# Patient Record
Sex: Male | Born: 1985 | Race: Black or African American | Hispanic: No | Marital: Single | Smoking: Never smoker
Health system: Southern US, Community
[De-identification: ages and names within clinical notes are randomized; demographics above are authoritative.]

---

## 2007-05-08 ENCOUNTER — Emergency Department (HOSPITAL_COMMUNITY): Admission: EM | Admit: 2007-05-08 | Discharge: 2007-05-08 | Payer: Self-pay | Admitting: Emergency Medicine

## 2008-11-04 ENCOUNTER — Emergency Department (HOSPITAL_COMMUNITY): Admission: EM | Admit: 2008-11-04 | Discharge: 2008-11-04 | Payer: Self-pay | Admitting: Emergency Medicine

## 2011-05-16 ENCOUNTER — Encounter (HOSPITAL_BASED_OUTPATIENT_CLINIC_OR_DEPARTMENT_OTHER): Payer: Self-pay | Admitting: *Deleted

## 2011-05-16 ENCOUNTER — Emergency Department (HOSPITAL_BASED_OUTPATIENT_CLINIC_OR_DEPARTMENT_OTHER)
Admission: EM | Admit: 2011-05-16 | Discharge: 2011-05-16 | Disposition: A | Discharge status: Home / Self Care | Payer: Self-pay | Attending: Emergency Medicine | Admitting: Emergency Medicine

## 2011-05-16 ENCOUNTER — Emergency Department (INDEPENDENT_AMBULATORY_CARE_PROVIDER_SITE_OTHER): Payer: Self-pay

## 2011-05-16 DIAGNOSIS — W010XXA Fall on same level from slipping, tripping and stumbling without subsequent striking against object, initial encounter: Secondary | ICD-10-CM | POA: Insufficient documentation

## 2011-05-16 DIAGNOSIS — S62639A Displaced fracture of distal phalanx of unspecified finger, initial encounter for closed fracture: Secondary | ICD-10-CM

## 2011-05-16 DIAGNOSIS — Y9367 Activity, basketball: Secondary | ICD-10-CM | POA: Insufficient documentation

## 2011-05-16 DIAGNOSIS — M79609 Pain in unspecified limb: Secondary | ICD-10-CM | POA: Insufficient documentation

## 2011-05-16 DIAGNOSIS — R209 Unspecified disturbances of skin sensation: Secondary | ICD-10-CM | POA: Insufficient documentation

## 2011-05-16 DIAGNOSIS — W19XXXA Unspecified fall, initial encounter: Secondary | ICD-10-CM

## 2011-05-16 MED ORDER — IBUPROFEN 200 MG PO TABS
600.0000 mg | ORAL_TABLET | Freq: Once | ORAL | Status: AC
  Administered 2011-05-16: 600 mg via ORAL
  Filled 2011-05-16: qty 1

## 2011-05-16 MED ORDER — IBUPROFEN 600 MG PO TABS: 600.0000 mg | ORAL_TABLET | Freq: Four times a day (QID) | ORAL | Status: AC | PRN

## 2011-05-16 MED ORDER — TRAMADOL HCL 50 MG PO TABS: 50.0000 mg | ORAL_TABLET | Freq: Four times a day (QID) | ORAL | Status: AC | PRN

## 2011-05-16 NOTE — Discharge Instructions (Signed)
Cast or Splint Care Casts and splints support injured limbs and keep bones from moving while they heal.  HOME CARE  Keep the cast or splint uncovered during the drying period.   A plaster cast can take 24 to 48 hours to dry.   A fiberglass cast will dry in less than 1 hour.   Do not rest the cast on anything harder than a pillow for 24 hours.   Do not put weight on your injured limb. Do not put pressure on the cast. Wait for your doctor's approval.   Keep the cast or splint dry.   Cover the cast or splint with a plastic bag during baths or wet weather.   If you have a cast over your chest and belly (trunk), take sponge baths until the cast is taken off.   Keep your cast or splint clean. Wash a dirty cast with a damp cloth.   Do not put any objects under your cast or splint. Do not scratch the skin under the cast with an object.   Do not take out the padding from inside your cast.   Exercise your joints near the cast as told by your doctor.   Raise (elevate) your injured limb on 1 or 2 pillows for the first 1 to 3 days.  GET HELP RIGHT AWAY IF:  Your cast or splint cracks.   Your cast or splint is too tight or too loose.   You itch badly under the cast.   Your cast gets wet or has a soft spot.   You have a bad smell coming from the cast.   You get an object stuck under the cast.   Your skin around the cast becomes red or raw.   You have new or more pain after the cast is put on.   You have fluid leaking through the cast.   You cannot move your fingers or toes.   Your fingers or toes turn colors or are cool, painful, or puffy (swollen).   You have tingling or lose feeling (numbness) around the injured area.   You have pain or pressure under the cast.   You have trouble breathing or have shortness of breath.   You have chest pain.  MAKE SURE YOU:  Understand these instructions.   Will watch your condition.   Will get help right away if you are not doing  well or get worse.  Document Released: 04/20/2010 Document Revised: 12/08/2010 Document Reviewed: 04/20/2010 ExitCare Patient Information 2012 [employer].Finger Fracture (Phalangeal) A broken bone of the finger (phalangealfracture) is a common injury for athletes. A single injury (trauma) is likely to fracture multiple bones on the same or different fingers. SYMPTOMS   Severe pain, at the time of injury.   Pain, tenderness, swelling, and later bruising of the finger and then the hand.   Visible deformity, if the fracture is complete and the bone fragments separate enough to distort the normal shape.   Numbness or coldness from swelling in the finger, causing pressure on blood vessels or nerves (uncommon).  CAUSES  Direct or indirect injury (trauma) to the finger.  RISK INCREASES WITH:   Contact sports (football, rugby) or other sports where injury to the hand is likely (soccer, baseball, basketball).   Sports that require hitting (boxing, martial arts).   History of bone or joint disease, such as osteoporosis, or previous bone restraint.   Poor hand strength and flexibility.  PREVENTION   For contact sports, wear   appropriate and properly fitted protective equipment for the hand.   Learn and use proper technique when hitting, punching, or landing after a fall.   If you had a previous finger injury or hand restraint, use tape or padding to protect the finger when playing sports where finger injury is likely.  PROGNOSIS  With proper treatment and normal alignment of the bones, healing can usually be expected in 4 to 6 weeks. Sometimes, surgery is needed.  RELATED COMPLICATIONS   Fracture does not heal (nonunion).   Bone heals in wrong position (malunion).   Chronic pain, stiffness, or swelling of the hand.   Excessive bleeding, causing pressure on nerves and blood vessels.   Unstable or arthritic joint, following repeated injury or delayed treatment.   Hindrance of  normal growth in children.   Infection in skin broken over the fracture (open fracture) or at the incision or pin sites from surgery.   Shortening of injured bones.   Bony bumps or loss of shape of the fingers.   Arthritic or stiff finger joint, if the fracture reaches the joint.  TREATMENT  If the bones are properly aligned, treatment involves ice and medicine to reduce pain and inflammation. Then, the finger is restrained for 4 or more weeks, to allow for healing. If the fracture is out of alignment (displaced), involves more than one bone, or involves a joint, surgery is usually advised. Surgery often involves placing removable pins, screws, and sometimes plates, to hold the bones in proper alignment. After restraint (with or without surgery), stretching and strengthening exercises are needed. Exercises may be completed at home or with a therapist. For certain sports, wearing a splint or having the finger taped during future activity is advised.  MEDICATION   If pain medicine is needed, nonsteroidal anti-inflammatory medicines (aspirin and ibuprofen), or other minor pain relievers (acetaminophen), are often advised.   Do not take pain medicine for 7 days before surgery.   Prescription pain relievers are usually prescribed only after surgery. Use only as directed and only as much as you need.  COLD THERAPY   Cold treatment (icing) relieves pain and reduces inflammation. Cold treatment should be applied for 10 to 15 minutes every 2 to 3 hours, and immediately after activity that aggravates your symptoms. Use ice packs or an ice massage.  SEEK MEDICAL CARE IF:   Pain, tenderness, or swelling gets worse, despite treatment.   You experience pain, numbness, or coldness in the hand.   Blue, gray, or dark color appears in the fingernails.   Any of the following occur after surgery: fever, increased pain, swelling, redness, drainage of fluids, or bleeding in the affected area.   New,  unexplained symptoms develop. (Drugs used in treatment may produce side effects.)  Document Released: 12/19/2004 Document Revised: 12/08/2010 Document Reviewed: 04/02/2008 ExitCare Patient Information 2012 [employer]. 

## 2011-05-16 NOTE — ED Provider Notes (Signed)
History     CSN: [id number]  Arrival date & time 05/16/11  4098   First MD Initiated Contact with Patient 05/16/11 [phone]      Chief Complaint  Patient presents with  . Hand Pain    (Consider location/radiation/quality/duration/timing/severity/associated sxs/prior treatment) HPI Pt p/w R hand pain after basketball injury 3-4 weeks ago. Pt states he fell onto outstretched R hand. Immediate pain and swelling. Pain has persisted and states he was stretching hand to today with exacerbation of pain. Pt mostly c/o pain of 5 th digit and ulnar surface of hand w/o wrist pain. +decreased sensation over 5th digit. Unable to fully close hand on right.  History reviewed. No pertinent past medical history.  History reviewed. No pertinent past surgical history.  History reviewed. No pertinent family history.  History  Substance Use Topics  . Smoking status: Not on file  . Smokeless tobacco: Not on file  . Alcohol Use: Not on file      Review of Systems  Skin: Negative for wound.  Neurological: Positive for numbness. Negative for weakness.    Allergies  Review of patient's allergies indicates no known allergies.  Home Medications   Current Outpatient Rx  Name Route Sig Dispense Refill  . IBUPROFEN 600 MG PO TABS Oral Take 1 tablet (600 mg total) by mouth every 6 (six) hours as needed for pain. 30 tablet 0  . TRAMADOL HCL 50 MG PO TABS Oral Take 1 tablet (50 mg total) by mouth every 6 (six) hours as needed for pain. 15 tablet 0    BP 117/69  Pulse 56  Temp(Src) 98.2 F (36.8 C) (Oral)  Resp 18  SpO2 100%  Physical Exam  Nursing note and vitals reviewed. Constitutional: He appears well-developed and well-nourished. No distress.  HENT:  Head: Normocephalic.  Neck: Normal range of motion. Neck supple.  Pulmonary/Chest: Effort normal.  Abdominal: Soft.  Musculoskeletal:       R hand with TTP over 5th digit and 5th metacarpal. No wrist or snuffbox TTP. Good cap refill. 2+  radial pulses. Decreased ROM of 5th digit and mildly decreased sensation over dorsal surface of 5th digit.   Neurological:       5/5 motor of intrinsic muscles of hand.   Skin: Skin is warm and dry.       No injury    ED Course  Procedures (including critical care time)  Labs Reviewed - No data to display Dg Hand Complete Right  05/16/2011  *RADIOLOGY REPORT*  Clinical Data: Larey Seat on hand playing basketball 2 weeks ago with pain in the fourth and fifth digits  RIGHT HAND - COMPLETE 3+ VIEW  Comparison: None.  Findings: Only seen on the lateral view there is a small avulsion fracture fragment from the base of the distal phalanx of the fifth digit on the palmar aspect.  No other bony abnormality is seen. MCP, PIP, and DIP joints are normal.  IMPRESSION: Small avulsion fracture fragment from the base of the distal phalanx of the right fifth digit.  Original Report Authenticated By: Juline Patch, M.D.     1. Avulsion fracture of distal phalanx of finger       MDM   Given cont pain and decreased mobility will place in splint and have f/u with hand surgeon.        Loren Racer, MD 05/16/11 1011

## 2011-05-16 NOTE — ED Notes (Signed)
Pt amb to room 2 with quick steady gait in nad. Pt reports injuring his right hand while playing basketball 4 weeks ago, cont with pain and swelling. Denies any other c/o.

## 2013-03-03 ENCOUNTER — Encounter (HOSPITAL_COMMUNITY): Payer: Self-pay | Admitting: Emergency Medicine

## 2013-03-03 ENCOUNTER — Emergency Department (HOSPITAL_COMMUNITY)
Admission: EM | Admit: 2013-03-03 | Discharge: 2013-03-03 | Disposition: A | Discharge status: Home / Self Care | Payer: Self-pay | Attending: Emergency Medicine | Admitting: Emergency Medicine

## 2013-03-03 DIAGNOSIS — K089 Disorder of teeth and supporting structures, unspecified: Secondary | ICD-10-CM | POA: Insufficient documentation

## 2013-03-03 DIAGNOSIS — K0889 Other specified disorders of teeth and supporting structures: Secondary | ICD-10-CM

## 2013-03-03 DIAGNOSIS — K029 Dental caries, unspecified: Secondary | ICD-10-CM | POA: Insufficient documentation

## 2013-03-03 MED ORDER — HYDROCODONE-ACETAMINOPHEN 5-325 MG PO TABS: 1.0000 | ORAL_TABLET | ORAL | Status: DC | PRN

## 2013-03-03 MED ORDER — AMOXICILLIN 500 MG PO CAPS: 500.0000 mg | ORAL_CAPSULE | Freq: Three times a day (TID) | ORAL | Status: DC

## 2013-03-03 NOTE — Discharge Instructions (Signed)
Dental Pain °A tooth ache may be caused by cavities (tooth decay). Cavities expose the nerve of the tooth to air and hot or cold temperatures. It may come from an infection or abscess (also called a boil or furuncle) around your tooth. It is also often caused by dental caries (tooth decay). This causes the pain you are having. °DIAGNOSIS  °Your caregiver can diagnose this problem by exam. °TREATMENT  °· If caused by an infection, it may be treated with medications which kill germs (antibiotics) and pain medications as prescribed by your caregiver. Take medications as directed. °· Only take over-the-counter or prescription medicines for pain, discomfort, or fever as directed by your caregiver. °· Whether the tooth ache today is caused by infection or dental disease, you should see your dentist as soon as possible for further care. °SEEK MEDICAL CARE IF: °The exam and treatment you received today has been provided on an emergency basis only. This is not a substitute for complete medical or dental care. If your problem worsens or new problems (symptoms) appear, and you are unable to meet with your dentist, call or return to this location. °SEEK IMMEDIATE MEDICAL CARE IF:  °· You have a fever. °· You develop redness and swelling of your face, jaw, or neck. °· You are unable to open your mouth. °· You have severe pain uncontrolled by pain medicine. °MAKE SURE YOU:  °· Understand these instructions. °· Will watch your condition. °· Will get help right away if you are not doing well or get worse. °Document Released: 12/19/2004 Document Revised: 03/13/2011 Document Reviewed: 08/07/2007 °ExitCare® Patient Information ©2014 [employer]. ° °Dental Care and Dentist Visits °Dental care supports good overall health. Regular dental visits can also help you avoid dental pain, bleeding, infection, and other more serious health problems in the future. It is important to keep the mouth healthy because diseases in the teeth, gums,  and other oral tissues can spread to other areas of the body. Some problems, such as diabetes, heart disease, and pre-term labor have been associated with poor oral health.  °See your dentist every 6 months. If you experience emergency problems such as a toothache or broken tooth, go to the dentist right away. If you see your dentist regularly, you may catch problems early. It is easier to be treated for problems in the early stages.  °WHAT TO EXPECT AT A DENTIST VISIT  °Your dentist will look for many common oral health problems and recommend proper treatment. At your regular dental visit, you can expect: °· Gentle cleaning of the teeth and gums. This includes scraping and polishing. This helps to remove the sticky substance around the teeth and gums (plaque). Plaque forms in the mouth shortly after eating. Over time, plaque hardens on the teeth as tartar. If tartar is not removed regularly, it can cause problems. Cleaning also helps remove stains. °· Periodic X-rays. These pictures of the teeth and supporting bone will help your dentist assess the health of your teeth. °· Periodic fluoride treatments. Fluoride is a natural mineral shown to help strengthen teeth. Fluoride treatment involves applying a fluoride gel or varnish to the teeth. It is most commonly done in children. °· Examination of the mouth, tongue, jaws, teeth, and gums to look for any oral health problems, such as: °· Cavities (dental caries). This is decay on the tooth caused by plaque, sugar, and acid in the mouth. It is best to catch a cavity when it is small. °· Inflammation of the gums   caused by plaque buildup (gingivitis). °· Problems with the mouth or malformed or misaligned teeth. °· Oral cancer or other diseases of the soft tissues or jaws.  °KEEP YOUR TEETH AND GUMS HEALTHY °For healthy teeth and gums, follow these general guidelines as well as your dentist's specific advice: °· Have your teeth professionally cleaned at the dentist every 6  months. °· Brush twice daily with a fluoride toothpaste. °· Floss your teeth daily.  °· Ask your dentist if you need fluoride supplements, treatments, or fluoride toothpaste. °· Eat a healthy diet. Reduce foods and drinks with added sugar. °· Avoid smoking. °TREATMENT FOR ORAL HEALTH PROBLEMS °If you have oral health problems, treatment varies depending on the conditions present in your teeth and gums. °· Your caregiver will most likely recommend good oral hygiene at each visit. °· For cavities, gingivitis, or other oral health disease, your caregiver will perform a procedure to treat the problem. This is typically done at a separate appointment. Sometimes your caregiver will refer you to another dental specialist for specific tooth problems or for surgery. °SEEK IMMEDIATE DENTAL CARE IF: °· You have pain, bleeding, or soreness in the gum, tooth, jaw, or mouth area. °· A permanent tooth becomes loose or separated from the gum socket. °· You experience a blow or injury to the mouth or jaw area. °Document Released: 08/31/2010 Document Revised: 03/13/2011 Document Reviewed: 08/31/2010 °ExitCare® Patient Information ©2014 [employer]. ° °

## 2013-03-03 NOTE — ED Provider Notes (Signed)
CSN: [id number]     Arrival date & time 03/03/13  1001 History   First MD Initiated Contact with Patient 03/03/13 1041     Chief Complaint  Patient presents with  . Dental Pain     (Consider location/radiation/quality/duration/timing/severity/associated sxs/prior Treatment) HPI   Patient presents to the emergency department with a dental complaint. Symptoms began  A few weeks ago but has been progressively getting worse. The patient has tried to alleviate pain with Ibuprofen.  Pain rated at a 10/10, characterized as throbbing in nature and located right lower back wisdom tooth. He reports it has a hole in it. He has a dental appointment coming up at [facility]. Patient denies fever, night sweats, chills, difficulty swallowing or opening mouth, SOB, nuchal rigidity or decreased ROM of neck.  Patient does not have a dentist and requests a resource guide at discharge.   History reviewed. No pertinent past medical history. History reviewed. No pertinent past surgical history. History reviewed. No pertinent family history. History  Substance Use Topics  . Smoking status: Never Smoker   . Smokeless tobacco: Not on file  . Alcohol Use: Yes     Comment: social    Review of Systems   The patient denies anorexia, fever, weight loss,, vision loss, decreased hearing, hoarseness, chest pain, syncope, dyspnea on exertion, peripheral edema, balance deficits, hemoptysis, abdominal pain, melena, hematochezia, severe indigestion/heartburn, hematuria, incontinence, genital sores, muscle weakness, suspicious skin lesions, transient blindness, difficulty walking, depression, unusual weight change, abnormal bleeding, enlarged lymph nodes, angioedema, and breast masses.  Allergies  Review of patient's allergies indicates no known allergies.  Home Medications   Current Outpatient Rx  Name  Route  Sig  Dispense  Refill  . ibuprofen (ADVIL,MOTRIN) 200 MG tablet   Oral   Take 200 mg by mouth every 6  (six) hours as needed.         Marland Kitchen amoxicillin (AMOXIL) 500 MG capsule   Oral   Take 1 capsule (500 mg total) by mouth 3 (three) times daily.   21 capsule   0   . HYDROcodone-acetaminophen (NORCO/VICODIN) 5-325 MG per tablet   Oral   Take 1-2 tablets by mouth every 4 (four) hours as needed.   20 tablet   0    BP 132/75  Pulse 53  Temp(Src) 97.6 F (36.4 C) (Oral)  Resp 16  SpO2 100% Physical Exam  Nursing note and vitals reviewed. Constitutional: He appears well-developed and well-nourished.  HENT:  Head: Normocephalic and atraumatic.  Mouth/Throat: Uvula is midline, oropharynx is clear and moist and mucous membranes are normal. No oral lesions. No trismus in the jaw. Dental caries present.    Eyes: Conjunctivae and EOM are normal. Pupils are equal, round, and reactive to light.  Neck: Normal range of motion. Neck supple.  Cardiovascular: Normal rate and regular rhythm.   Pulmonary/Chest: Effort normal and breath sounds normal.    ED Course  Procedures (including critical care time) Labs Review Labs Reviewed - No data to display Imaging Review No results found.   EKG Interpretation None      MDM   Final diagnoses:  Pain, dental    Patient has dental pain. No emergent s/sx's present. Patent airway. No trismus.  Will be given pain medication and antibiotics. Offered dental block- declined. I discussed the need to call dentist within 24/48 hours for follow-up. Dental referral given. Return to ED precautions given.  Pt voiced understanding and has agreed to follow-up.   28 y.o.Apolinar Junes  Donegan's evaluation in the Emergency Department is complete. It has been determined that no acute conditions requiring further emergency intervention are present at this time. The patient/guardian have been advised of the diagnosis and plan. We have discussed signs and symptoms that warrant return to the ED, such as changes or worsening in symptoms.  Vital signs are stable at  discharge. Filed Vitals:   03/03/13 1007  BP: 132/75  Pulse: 53  Temp: 97.6 F (36.4 C)  Resp: 16    Patient/guardian has voiced understanding and agreed to follow-up with the PCP or specialist.      Dorthula Matasiffany G Justyce Yeater, PA-C 03/03/13 1104

## 2013-03-03 NOTE — ED Provider Notes (Signed)
Medical screening examination/treatment/procedure(s) were performed by non-physician practitioner and as supervising physician I was immediately available for consultation/collaboration.   EKG Interpretation None        Jissell Trafton, MD 03/03/13 1519 

## 2013-03-03 NOTE — ED Notes (Signed)
Per pt, toothache x 2 weeks.  Pain is getting worse.  Has appt for Thursday.

## 2013-04-05 IMAGING — CR DG HAND COMPLETE 3+V*R*
3 series · 3 of 3 positions shown · non-contrast
Comparison: None.

CLINICAL DATA: Fell on hand playing basketball 2 weeks ago with
pain in the fourth and fifth digits

RIGHT HAND - COMPLETE 3+ VIEW

[x hand pa right]
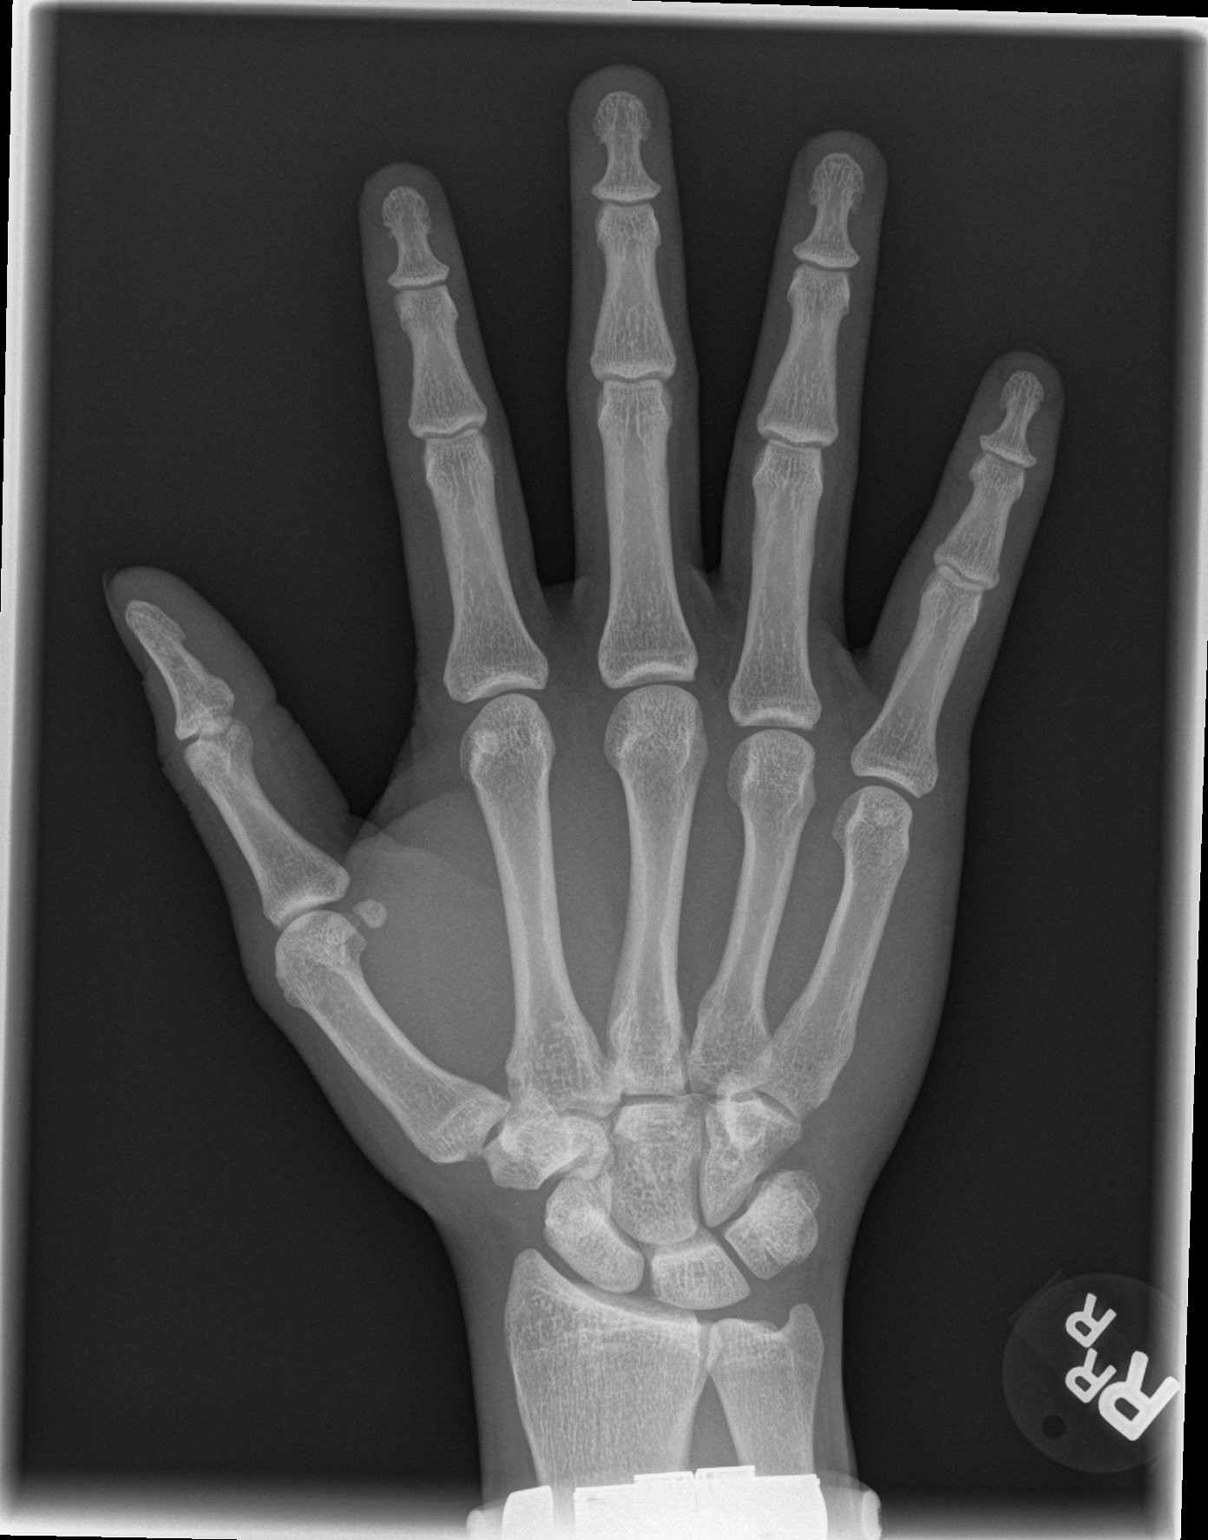

[x hand oblique right]
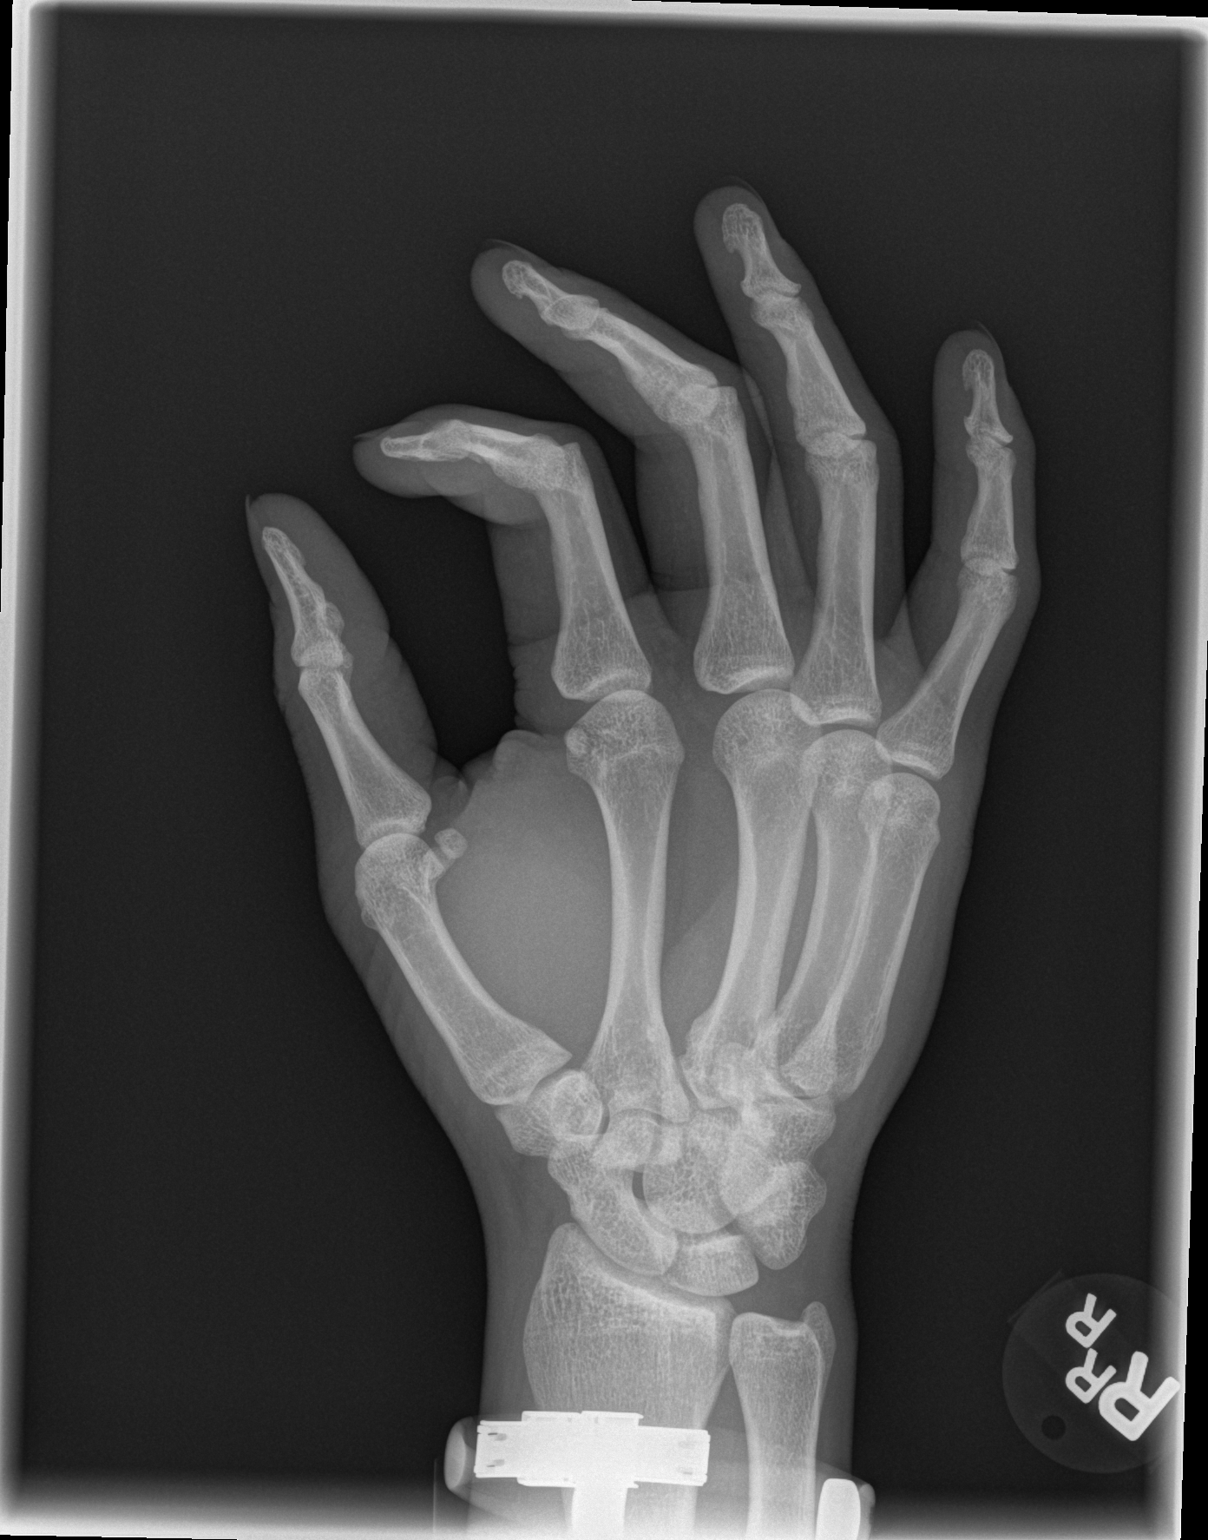

[x hand lat right]
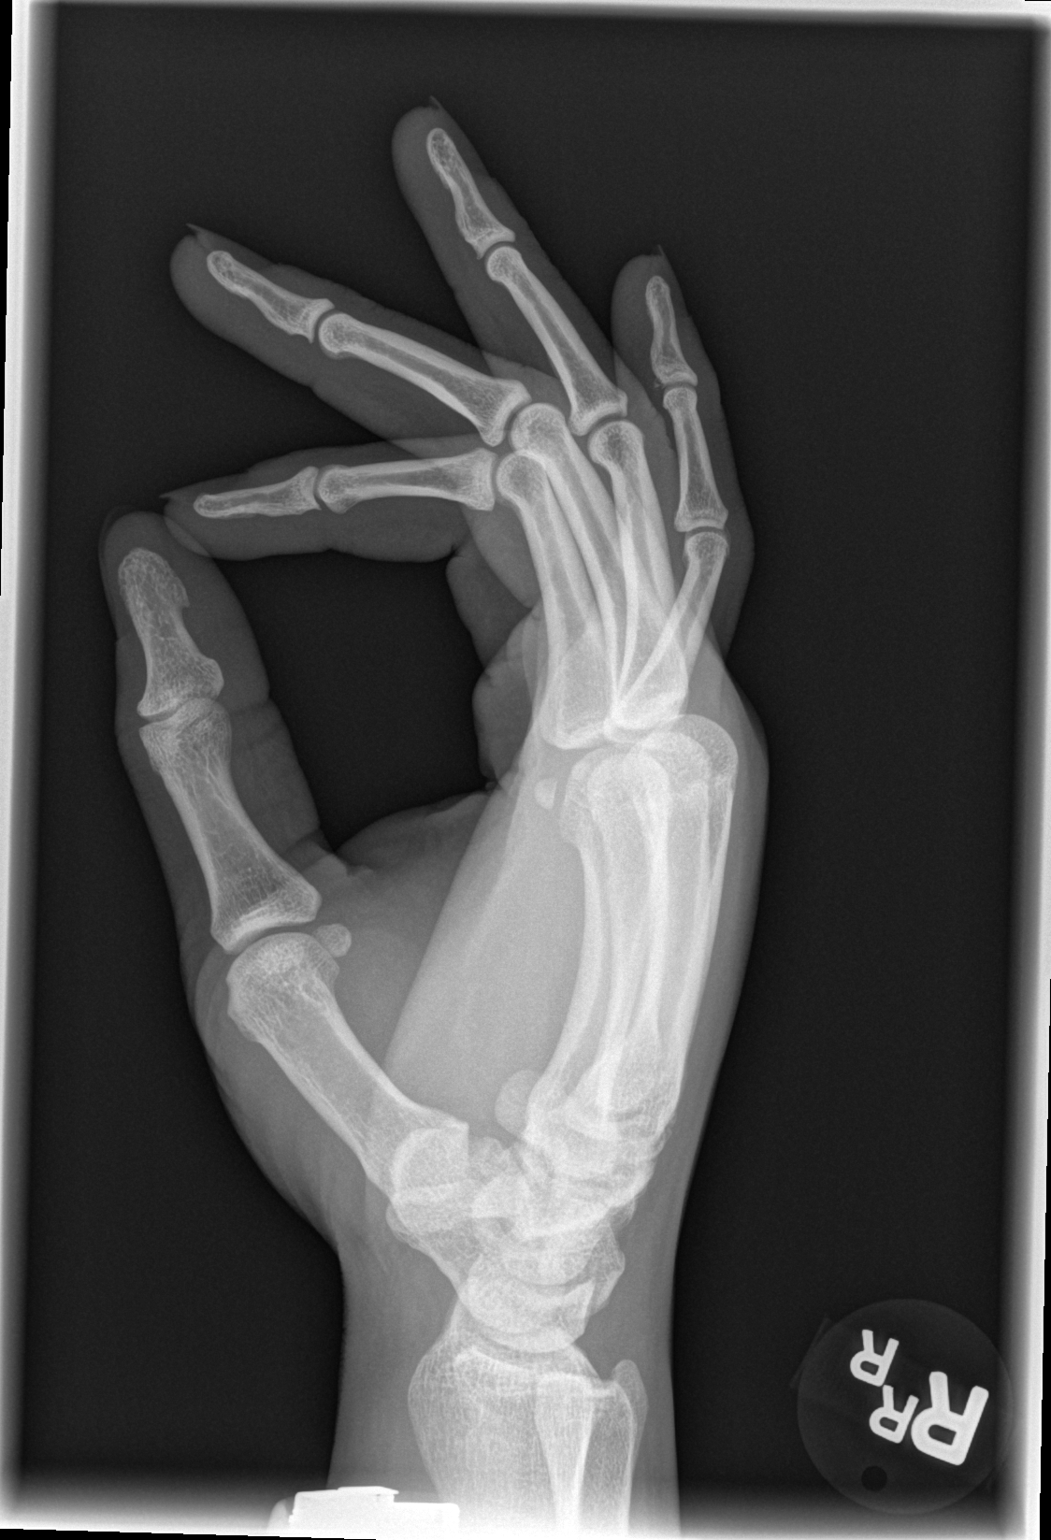

[3 of 3 positions shown; findings below may reference images not displayed]

FINDINGS: Only seen on the lateral view there is a small avulsion
fracture fragment from the base of the distal phalanx of the fifth
digit on the palmar aspect.  No other bony abnormality is seen.
MCP, PIP, and DIP joints are normal.
IMPRESSION: Small avulsion fracture fragment from the base of the distal
phalanx of the right fifth digit.

## 2017-05-19 ENCOUNTER — Encounter (HOSPITAL_BASED_OUTPATIENT_CLINIC_OR_DEPARTMENT_OTHER): Payer: Self-pay | Admitting: Emergency Medicine

## 2017-05-19 ENCOUNTER — Emergency Department (HOSPITAL_BASED_OUTPATIENT_CLINIC_OR_DEPARTMENT_OTHER)
Admission: EM | Admit: 2017-05-19 | Discharge: 2017-05-19 | Disposition: A | Discharge status: Home / Self Care | Payer: 59 | Attending: Emergency Medicine | Admitting: Emergency Medicine

## 2017-05-19 ENCOUNTER — Other Ambulatory Visit: Payer: Self-pay

## 2017-05-19 DIAGNOSIS — K0889 Other specified disorders of teeth and supporting structures: Secondary | ICD-10-CM | POA: Diagnosis present

## 2017-05-19 DIAGNOSIS — Z79899 Other long term (current) drug therapy: Secondary | ICD-10-CM | POA: Insufficient documentation

## 2017-05-19 MED ORDER — PENICILLIN V POTASSIUM 500 MG PO TABS: 500.0000 mg | ORAL_TABLET | Freq: Four times a day (QID) | ORAL | 0 refills | Status: AC

## 2017-05-19 MED ORDER — LIDOCAINE VISCOUS HCL 2 % MT SOLN: 15.0000 mL | OROMUCOSAL | 0 refills | Status: AC | PRN

## 2017-05-19 MED ORDER — TRAMADOL HCL 50 MG PO TABS: 50.0000 mg | ORAL_TABLET | Freq: Four times a day (QID) | ORAL | 0 refills | Status: DC | PRN

## 2017-05-19 NOTE — ED Triage Notes (Signed)
L lower dental pain x 1 week, states he has a broken tooth.

## 2017-05-19 NOTE — ED Provider Notes (Signed)
[facility] EMERGENCY DEPARTMENT Provider Note   CSN: [id number] Arrival date & time: 05/19/17  1010     History   Chief Complaint Chief Complaint  Patient presents with  . Dental Pain    HPI Richard Odonnell is a 32 y.o. male.  HPI 32 year old African-American male with no pertinent past medical history presents to the ED for complaints of left lower dental pain x1 week.  Patient states he has a broken tooth.  Has an appointment with a dentist next week.  Pain became unbearable today.  Reports some mild facial swelling.  Denies any difficulties breathing or swallowing.  Has been taking Motrin and Tylenol for pain at home with only little relief.  Nothing makes better.  Palpation and eating makes the pain worse.  Reports some radiation of pain to the left ear and a mild headache.  Denies any lightheadedness, dizziness or vision changes.  Denies any neck pain or stiffness.  Has any associated fevers or chills. History reviewed. No pertinent past medical history.  There are no active problems to display for this patient.   History reviewed. No pertinent surgical history.      Home Medications    Prior to Admission medications   Medication Sig Start Date End Date Taking? Authorizing Provider  amoxicillin (AMOXIL) 500 MG capsule Take 1 capsule (500 mg total) by mouth 3 (three) times daily. 03/03/13   Marlon Pel, PA-C  HYDROcodone-acetaminophen (NORCO/VICODIN) 5-325 MG per tablet Take 1-2 tablets by mouth every 4 (four) hours as needed. 03/03/13   Marlon Pel, PA-C  ibuprofen (ADVIL,MOTRIN) 200 MG tablet Take 200 mg by mouth every 6 (six) hours as needed.    [provider]  lidocaine (XYLOCAINE) 2 % solution Use as directed 15 mLs in the mouth or throat as needed for mouth pain. 05/19/17   Demetrios Loll T, PA-C  penicillin v potassium (VEETID) 500 MG tablet Take 1 tablet (500 mg total) by mouth 4 (four) times daily for 7 days. 05/19/17 05/26/17  Rise Mu, PA-C  traMADol (ULTRAM) 50 MG tablet Take 1 tablet (50 mg total) by mouth every 6 (six) hours as needed. 05/19/17   Rise Mu, PA-C    Family History No family history on file.  Social History Social History   Tobacco Use  . Smoking status: Never Smoker  . Smokeless tobacco: Never Used  Substance Use Topics  . Alcohol use: Yes    Comment: social  . Drug use: Yes    Types: Marijuana     Allergies   Patient has no known allergies.   Review of Systems Review of Systems  All other systems reviewed and are negative.    Physical Exam Updated Vital Signs BP (!) 123/91 (BP Location: Right Arm)   Pulse (!) 48   Temp 97.9 F (36.6 C) (Oral)   Resp 18   Ht  (1.753 m)   Wt 82.6 kg (182 lb)   SpO2 100%   BMI 26.88 kg/m   Physical Exam  Constitutional: He appears well-developed and well-nourished. No distress.  HENT:  Head: Normocephalic and atraumatic.  Right Ear: Tympanic membrane, external ear and ear canal normal.  Left Ear: Tympanic membrane, external ear and ear canal normal.  Nose: Nose normal.  Mouth/Throat: Uvula is midline, oropharynx is clear and moist and mucous membranes are normal. No trismus in the jaw. No uvula swelling.    No sublingual or submandibular swelling.  Oropharynx is clear.  Managing secretions.  Tolerating his airway.  Speaking complete sentences.  Eyes: Right eye exhibits no discharge. Left eye exhibits no discharge. No scleral icterus.  Neck: Normal range of motion. Neck supple.  Pulmonary/Chest: Effort normal. No respiratory distress.  Musculoskeletal: Normal range of motion.  Lymphadenopathy:    He has no cervical adenopathy.  Neurological: He is alert.  Skin: No pallor.  Psychiatric: His behavior is normal. Judgment and thought content normal.  Nursing note and vitals reviewed.    ED Treatments / Results  Labs (all labs ordered are listed, but only abnormal results are displayed) Labs Reviewed - No  data to display  EKG None  Radiology No results found.  Procedures Procedures (including critical care time)  Medications Ordered in ED Medications - No data to display   Initial Impression / Assessment and Plan / ED Course  I have reviewed the triage vital signs and the nursing notes.  Pertinent labs & imaging results that were available during my care of the patient were reviewed by me and considered in my medical decision making (see chart for details).     Patient with toothache.  No gross abscess.  Exam unconcerning for Ludwig's angina or spread of infection.  Will treat with penicillin and pain medicine.  Urged patient to follow-up with dentist.    Pt is hemodynamically stable, in NAD, & able to ambulate in the ED. Evaluation does not show pathology that would require ongoing emergent intervention or inpatient treatment. I explained the diagnosis to the patient. Pain has been managed & has no complaints prior to dc. Pt is comfortable with above plan and is stable for discharge at this time. All questions were answered prior to disposition. Strict return precautions for f/u to the ED were discussed. Encouraged follow up with PCP.    Final Clinical Impressions(s) / ED Diagnoses   Final diagnoses:  Pain, dental    ED Discharge Orders        Ordered    penicillin v potassium (VEETID) 500 MG tablet  4 times daily     05/19/17 1050    lidocaine (XYLOCAINE) 2 % solution  As needed     05/19/17 1050    traMADol (ULTRAM) 50 MG tablet  Every 6 hours PRN     05/19/17 1050       Rise Mu, PA-C 05/19/17 1108    Raeford Razor, MD 05/19/17 1140

## 2017-05-19 NOTE — ED Notes (Signed)
ED Provider at bedside. 

## 2017-05-19 NOTE — Discharge Instructions (Addendum)

## 2018-12-21 ENCOUNTER — Encounter: Payer: Self-pay | Admitting: Emergency Medicine

## 2018-12-21 ENCOUNTER — Other Ambulatory Visit: Payer: Self-pay

## 2018-12-21 ENCOUNTER — Ambulatory Visit
Admission: EM | Admit: 2018-12-21 | Discharge: 2018-12-21 | Disposition: A | Discharge status: Home / Self Care | Payer: 59 | Attending: Physician Assistant | Admitting: Physician Assistant

## 2018-12-21 DIAGNOSIS — G510 Bell's palsy: Secondary | ICD-10-CM

## 2018-12-21 MED ORDER — PREDNISONE 20 MG PO TABS: 60.0000 mg | ORAL_TABLET | Freq: Every day | ORAL | 0 refills | Status: AC

## 2018-12-21 NOTE — Discharge Instructions (Signed)
Start prednisone as directed. Artificial tear gel (systane/genteal) at night and tape eye shut to prevent injury. Follow up with PCP/neurology if symptoms not improving for reevaluation needed.

## 2018-12-21 NOTE — ED Provider Notes (Signed)
[facility]    CSN: [id number] Arrival date & time: 12/21/18  1110      History   Chief Complaint Chief Complaint  Patient presents with  . Numbness    HPI Richard Odonnell is a 33 y.o. male.   33 year old male with no significant past medical history comes in for a 1 week history of facial numbness, facial drooping.  States has felt that his facial muscles/nerves " has not been acting right".  Started noticing numbness to the mouth, and difference in taste.  Has had trouble closing left eye completely, and has noticed left facial drooping.  He denies one-sided weakness, dizziness, syncope.  Denies head injury, loss of consciousness.  Denies fever, chills, body aches.  Denies URI symptoms such as cough, congestion, sore throat.  He has not tried anything for the symptoms.     History reviewed. No pertinent past medical history.  There are no problems to display for this patient.   History reviewed. No pertinent surgical history.     Home Medications    Prior to Admission medications   Medication Sig Start Date End Date Taking? Authorizing Provider  ibuprofen (ADVIL,MOTRIN) 200 MG tablet Take 200 mg by mouth every 6 (six) hours as needed.    [provider]  lidocaine (XYLOCAINE) 2 % solution Use as directed 15 mLs in the mouth or throat as needed for mouth pain. 05/19/17   Rise Mu, PA-C  predniSONE (DELTASONE) 20 MG tablet Take 3 tablets (60 mg total) by mouth daily with breakfast for 7 days. 12/21/18 12/28/18  Belinda Fisher, PA-C    Family History Family History  Problem Relation Age of Onset  . Diabetes Maternal Grandmother   . Hypertension Maternal Grandmother     Social History Social History   Tobacco Use  . Smoking status: Never Smoker  . Smokeless tobacco: Never Used  Substance Use Topics  . Alcohol use: Yes    Comment: social  . Drug use: Yes    Types: Marijuana     Allergies   Patient has no known  allergies.   Review of Systems Review of Systems  Reason unable to perform ROS: See HPI as above.     Physical Exam Triage Vital Signs ED Triage Vitals  Enc Vitals Group     BP 12/21/18 1145 114/69     Pulse Rate 12/21/18 1145 (!) 55     Resp 12/21/18 1145 16     Temp 12/21/18 1145 98.1 F (36.7 C)     Temp src --      SpO2 12/21/18 1145 97 %     Weight --      Height --      Head Circumference --      Peak Flow --      Pain Score 12/21/18 1148 0     Pain Loc --      Pain Edu? --      Excl. in GC? --    No data found.  Updated Vital Signs BP 114/69   Pulse (!) 55   Temp 98.1 F (36.7 C)   Resp 16   SpO2 97%   Physical Exam Constitutional:      General: He is not in acute distress.    Appearance: Normal appearance. He is not ill-appearing, toxic-appearing or diaphoretic.  HENT:     Head: Normocephalic and atraumatic.     Mouth/Throat:     Mouth: Mucous membranes are moist.  Pharynx: Oropharynx is clear. Uvula midline.  Eyes:     Extraocular Movements: Extraocular movements intact.     Pupils: Pupils are equal, round, and reactive to light.     Comments: Left eyelid drooping slightly.   Right eye with white defect to the cornea. Contact lens present. No conjunctival injection.  Left eye without conjunctival injection, defect.   Cardiovascular:     Rate and Rhythm: Normal rate and regular rhythm.     Heart sounds: Normal heart sounds. No murmur. No friction rub. No gallop.   Pulmonary:     Effort: Pulmonary effort is normal. No accessory muscle usage, prolonged expiration, respiratory distress or retractions.     Comments: Lungs clear to auscultation without adventitious lung sounds. Musculoskeletal:     Cervical back: Normal range of motion and neck supple. No pain with movement, spinous process tenderness or muscular tenderness. Normal range of motion.  Neurological:     General: No focal deficit present.     Mental Status: He is alert and oriented  to person, place, and time.     GCS: GCS eye subscore is 4. GCS verbal subscore is 5. GCS motor subscore is 6.     Coordination: Coordination is intact. Romberg sign negative. Coordination normal. Finger-Nose-Finger Test and Heel to [facility] Test normal.     Gait: Gait is intact.     Comments: Patient with sagging left eyebrow. Left eyelid strength decreased, although able to close eye completely. Left facial drooping. Sensation to the face intact and equal bilaterally.  Upper extremity strength 5/5 bilaterally, sensation 5/5 bilaterally.  Lower extremity strength 5/5 bilaterally, sensation 5/5 bilaterally.       UC Treatments / Results  Labs (all labs ordered are listed, but only abnormal results are displayed) Labs Reviewed - No data to display  EKG   Radiology No results found.  Procedures Procedures (including critical care time)  Medications Ordered in UC Medications - No data to display  Initial Impression / Assessment and Plan / UC Course  I have reviewed the triage vital signs and the nursing notes.  Pertinent labs & imaging results that were available during my care of the patient were reviewed by me and considered in my medical decision making (see chart for details).    History and exam most consistent with Bell's palsy. Lower suspicion for CVA at this time. Prednisone as directed. Follow up with PCP/neurology for monitoring needed. Return precautions given.  Eye exam revealed white defect to the right cornea questionable for corneal ulceration. However, patient declined further examination including [facility], fluorescein stain.  Risks and benefits discussed, patient would still like to defer.  Final Clinical Impressions(s) / UC Diagnoses   Final diagnoses:  Bell's palsy   ED Prescriptions    Medication Sig Dispense Auth. Provider   predniSONE (DELTASONE) 20 MG tablet Take 3 tablets (60 mg total) by mouth daily with breakfast for 7 days. 21 tablet Ok Edwards, PA-C      PDMP not reviewed this encounter.   Ok Edwards, PA-C 12/21/18 1414

## 2018-12-21 NOTE — ED Triage Notes (Signed)
Pt states " I feel like my facial muscles or nerves, something isn't right. My whole mouth is numb, i've noticed it about a week." Pt states he tried to eat something and it feels numb. Whole mouth, bilateral numbness. Pt states his lips feel swollen, his mouth is twisted. Pt states he cant close his L eye very well.
